# Patient Record
Sex: Male | Born: 2000 | Race: White | Hispanic: No | Marital: Single | Smoking: Never smoker
Health system: Southern US, Community
[De-identification: ages and names within clinical notes are randomized; demographics above are authoritative.]

---
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
## 2021-01-16 IMAGING — US US SCROTUM W/ DOPPLER COMPLETE
1 series · 14 of 25 positions shown · non-contrast
Comparison: None.

CLINICAL DATA: Left testicular pain for 3 days

EXAM:
SCROTAL ULTRASOUND
DOPPLER ULTRASOUND OF THE TESTICLES
TECHNIQUE: Complete ultrasound examination of the testicles, epididymis, and
other scrotal structures was performed. Color and spectral Doppler
ultrasound were also utilized to evaluate blood flow to the
testicles.

[Series 1: us scrotum w/ doppler complete · 14 of 85 slices shown]
[im 1/85]
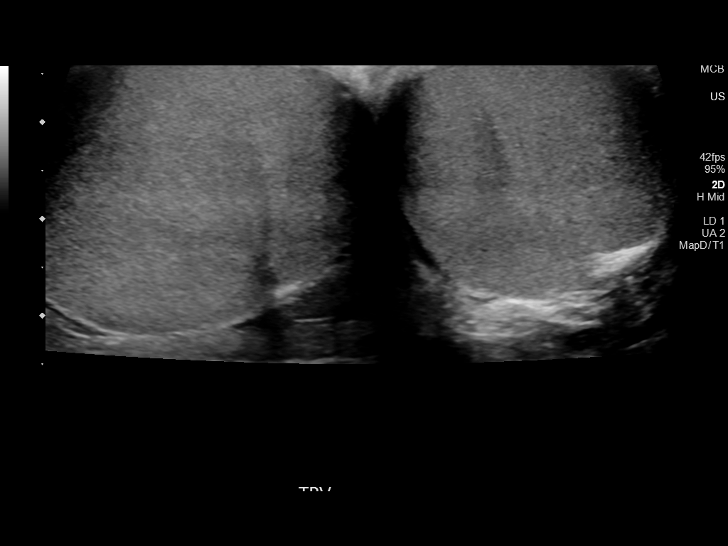
[im 8/85]
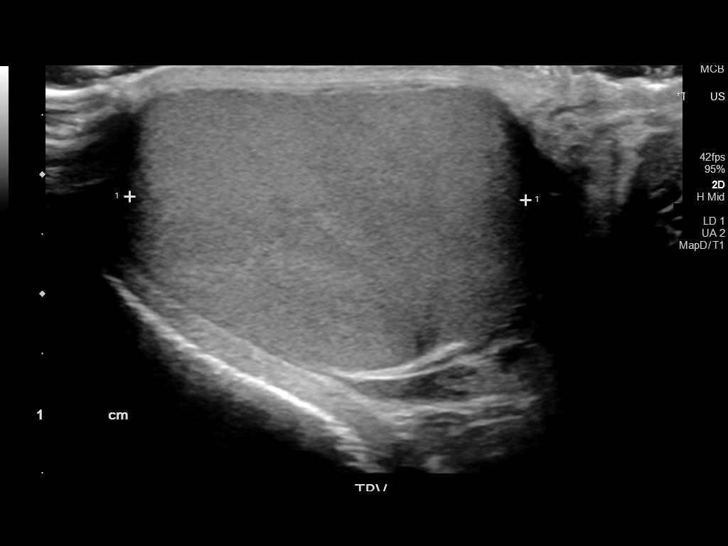
[im 15/85]
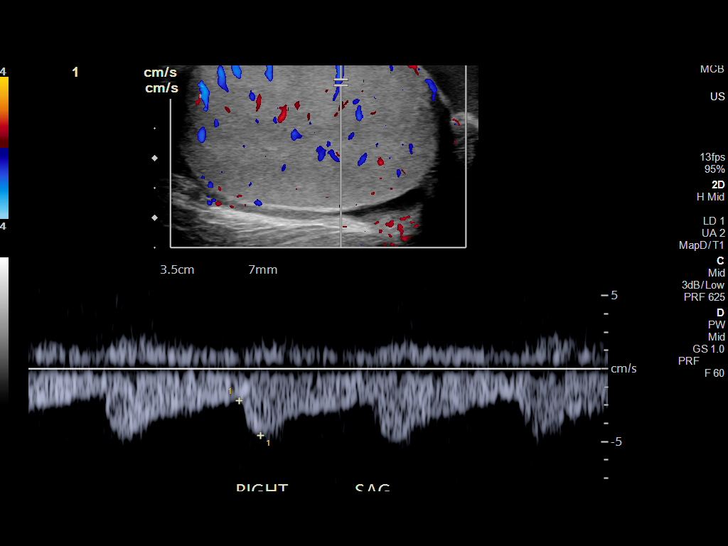
[im 22/85]
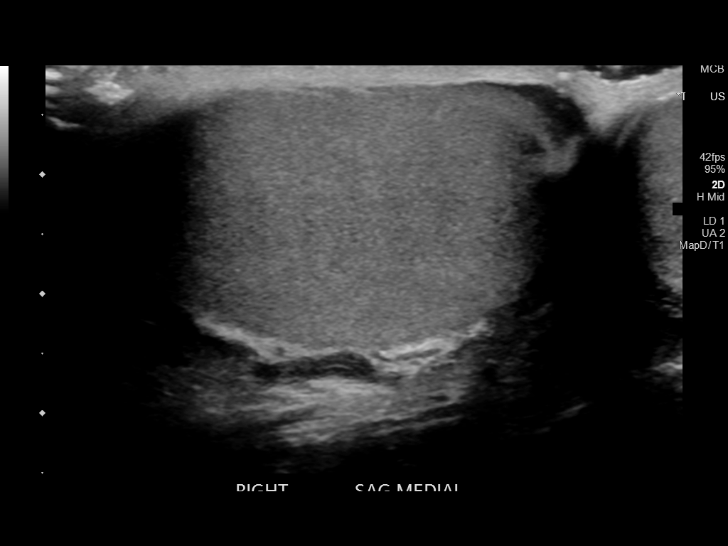
[im 29/85]
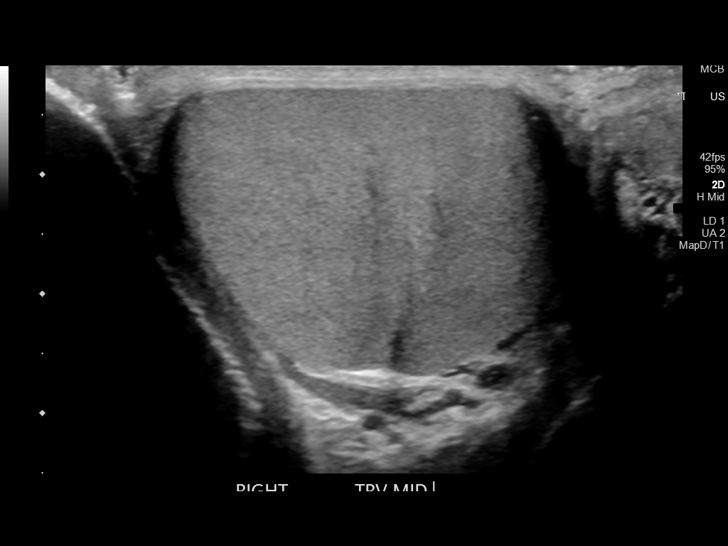
[im 32/85]
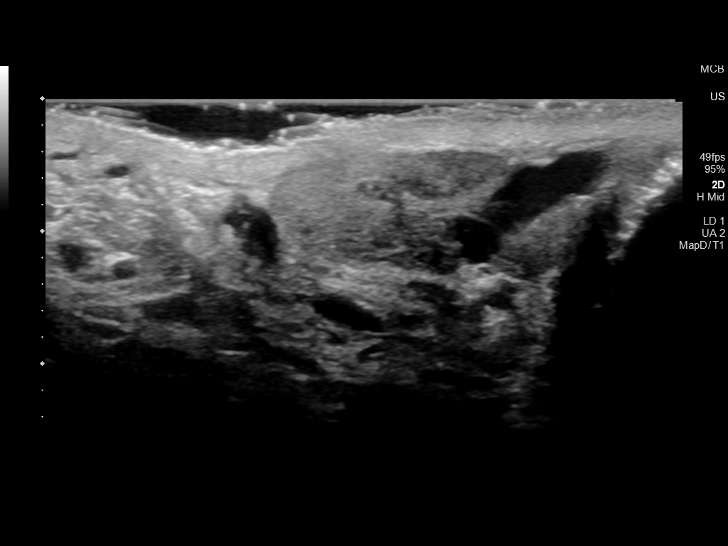
[im 39/85]
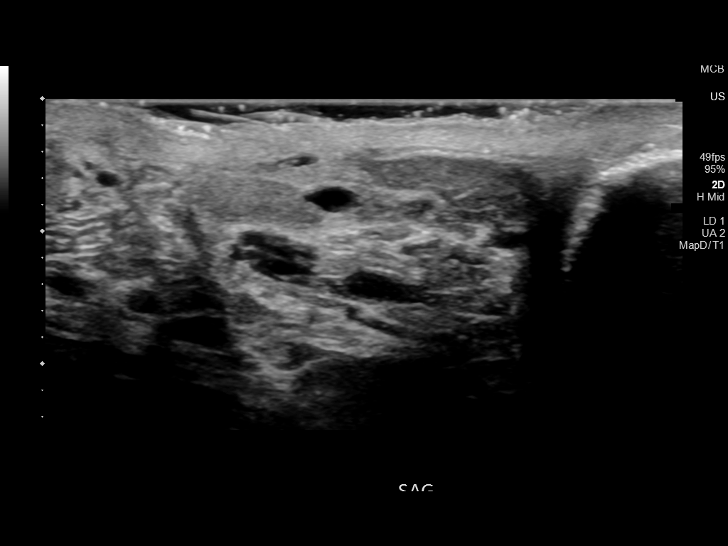
[im 46/85]
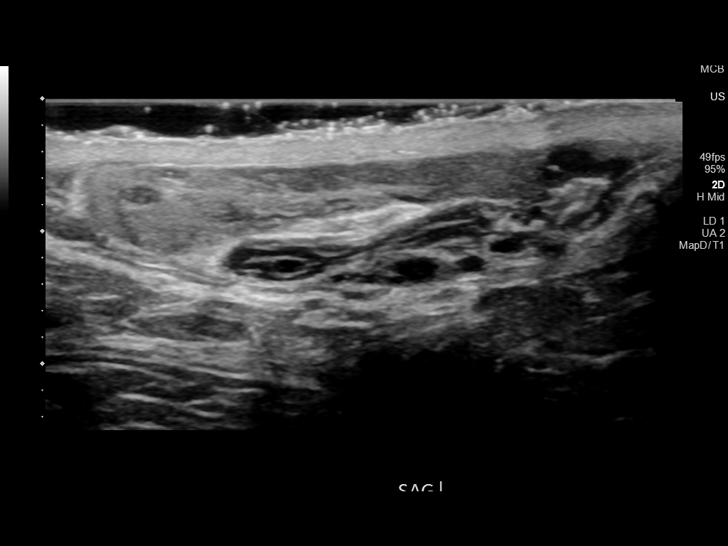
[im 53/85]
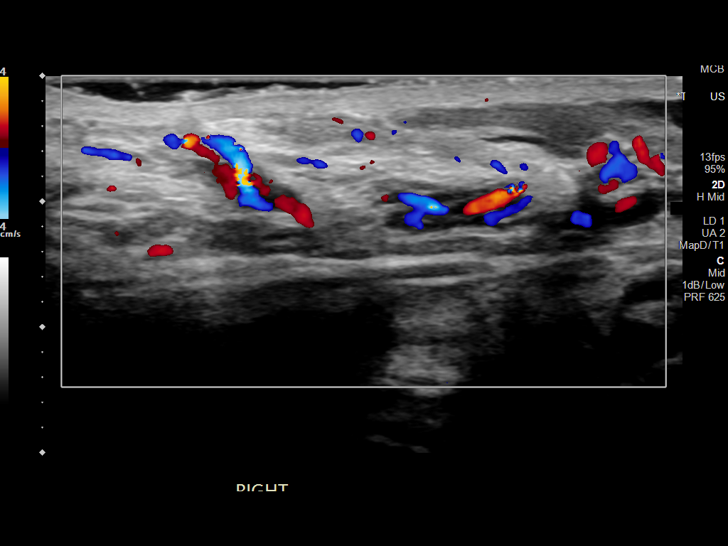
[im 57/85]
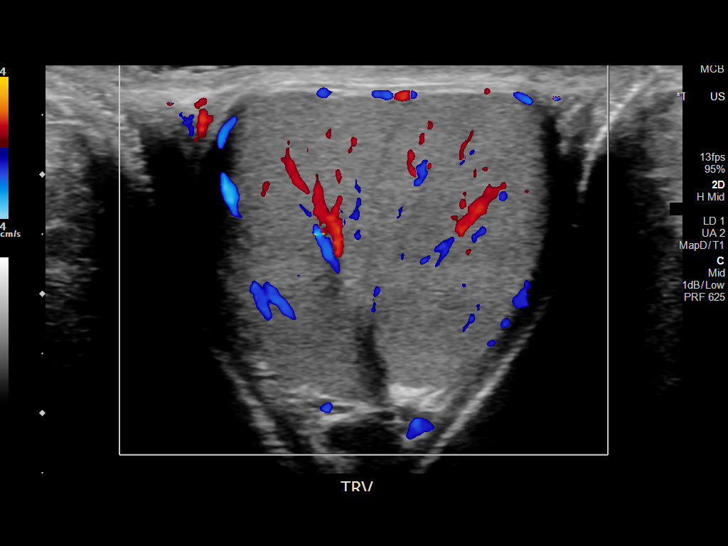
[im 64/85]
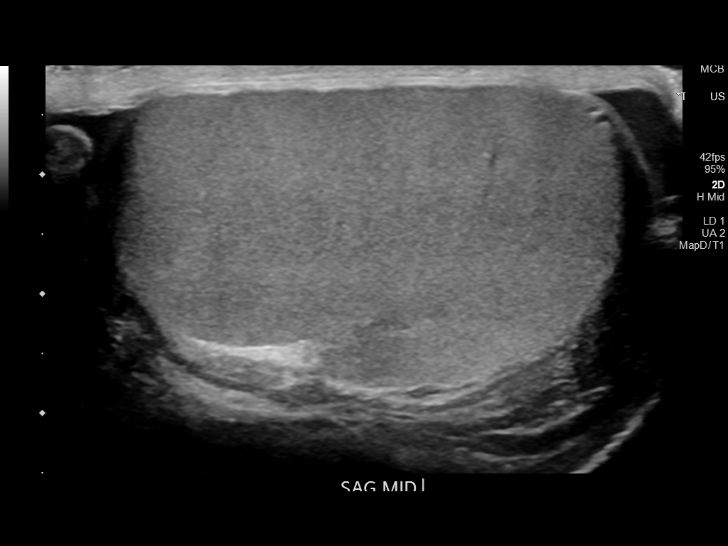
[im 71/85]
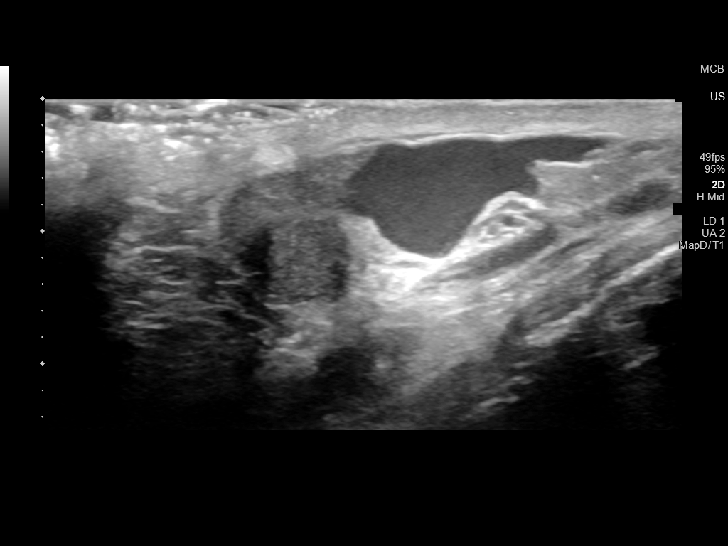
[im 78/85]
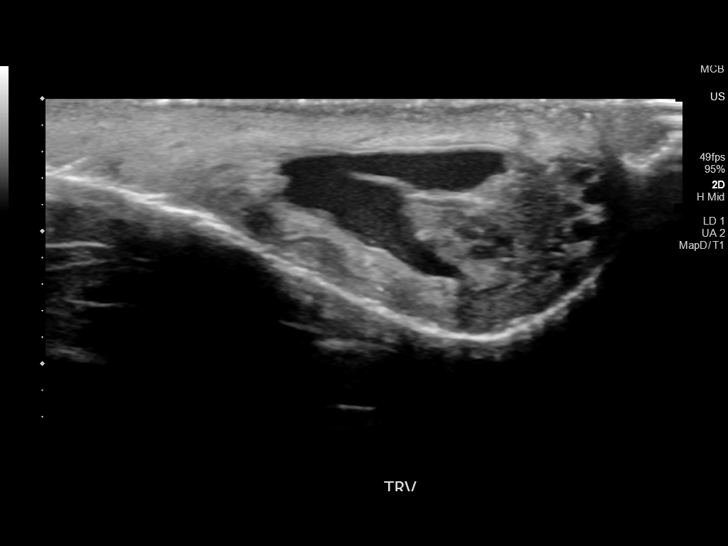
[im 85/85]
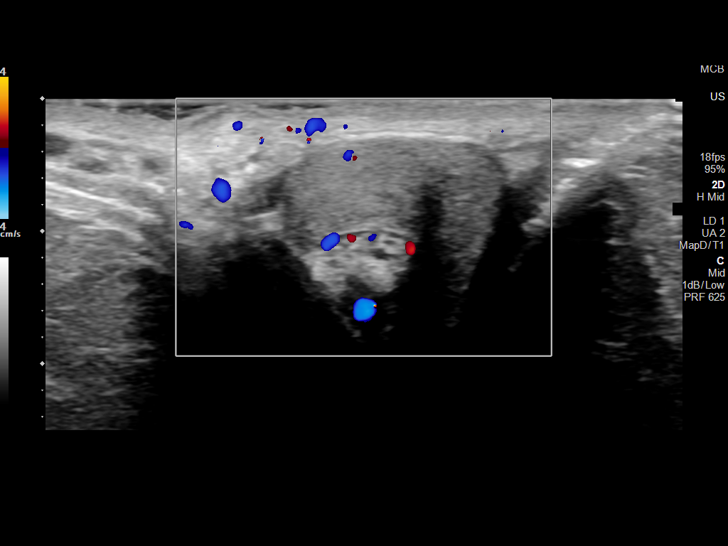

[14 of 25 positions shown; findings below may reference images not displayed]

FINDINGS: Right testicle

Measurements: 4.6 x 2.5 x 3.3 cm. No mass or microlithiasis
visualized.

Left testicle

Measurements: 4.3 x 2.6 x 2.8 cm. No mass or microlithiasis
visualized.

Right epididymis: Normal in size and appearance. 3 mm anechoic right
epididymal head cyst.

Left epididymis: Normal in size and appearance. 3 mm anechoic left
epididymal head cyst.

Hydrocele: Small left hydrocele with some low level internal echoes.
No right hydrocele.

Varicocele:  None visualized.

Pulsed Doppler interrogation of both testes demonstrates normal low
resistance arterial and venous waveforms bilaterally.
IMPRESSION: No evidence of testicular mass or torsion.

Trace nonspecific left hydrocele with low-level internal echoes. No
convincing features of epididymo-orchitis.

## 2021-02-12 IMAGING — US US PELVIS COMPLETE
1 series · 14 of 25 positions shown · non-contrast
Comparison: None

CLINICAL DATA: BILATERAL groin pain

EXAM:
LIMITED ULTRASOUND OF PELVIS
TECHNIQUE: Limited transabdominal ultrasound examination of the pelvis was
performed.

[Series 1: us pelvis complete · 41 acquisitions, 14 frames shown]
[im 1/41]
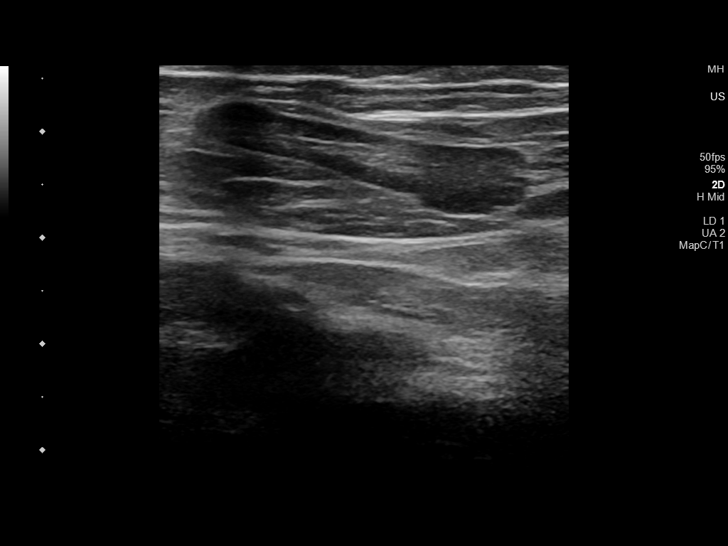
[im 4/41]
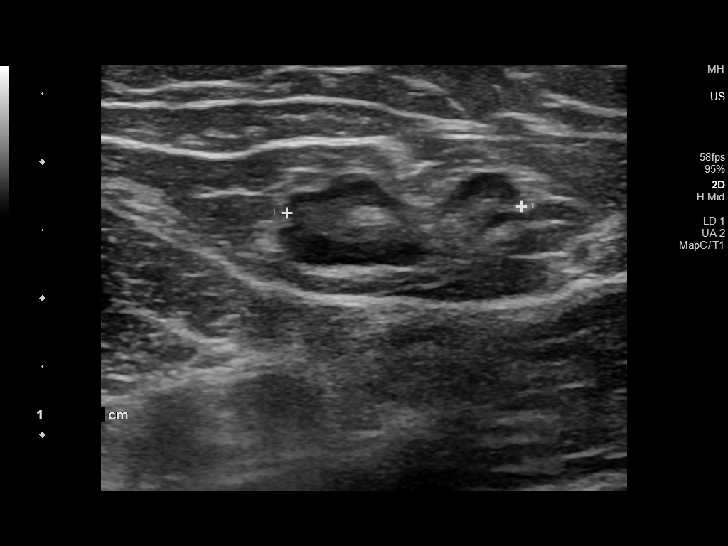
[im 7/41]
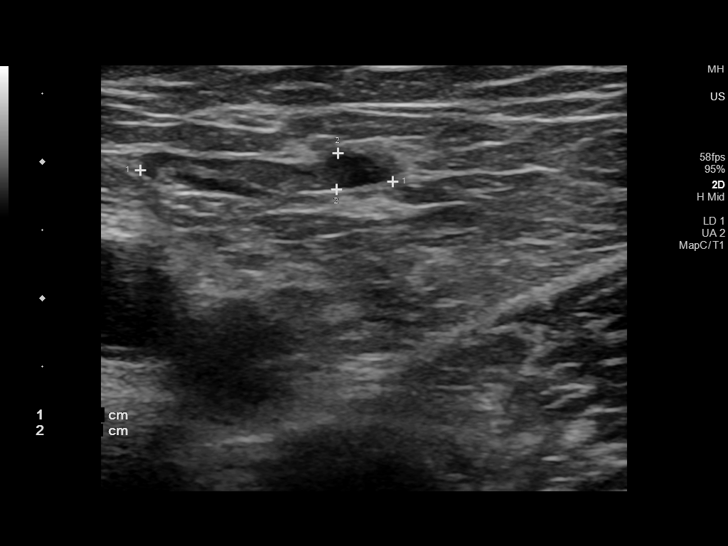
[im 11/41]
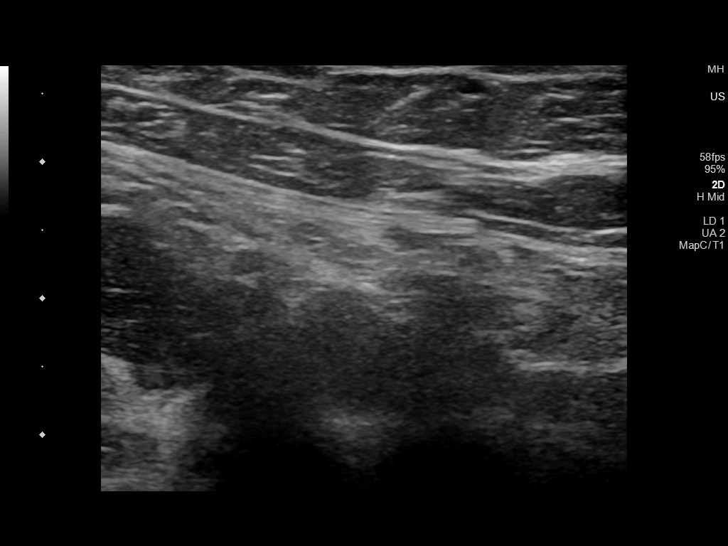
[im 14/41]
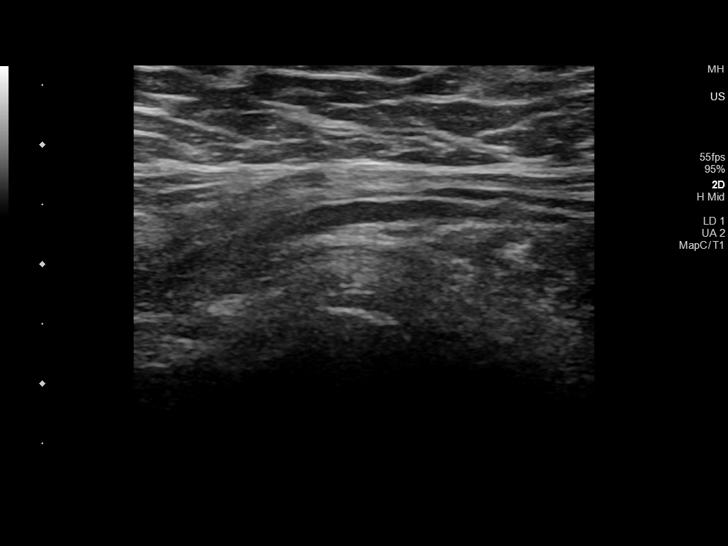
[im 16/41]
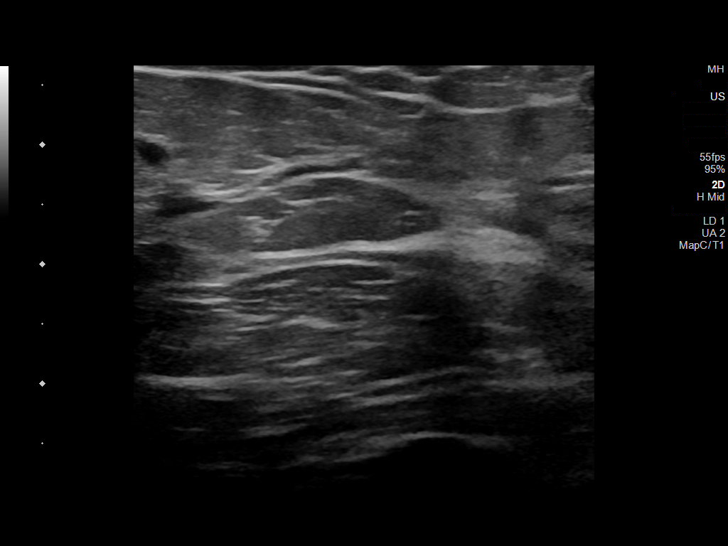
[im 19/41]
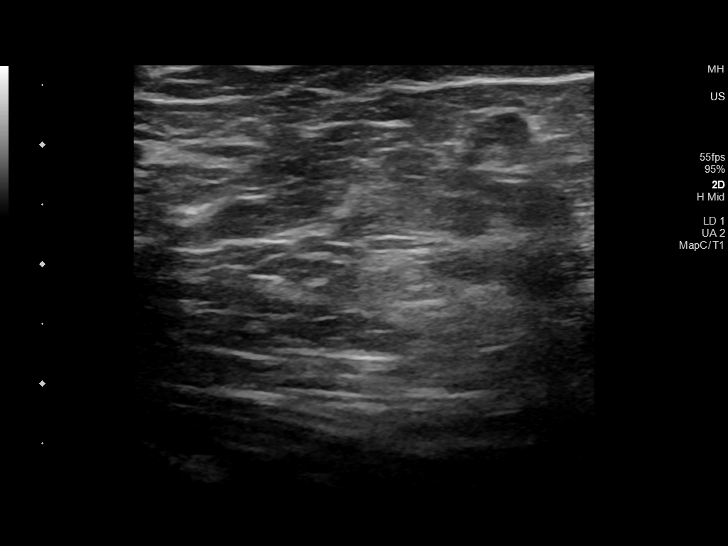
[im 22/41]
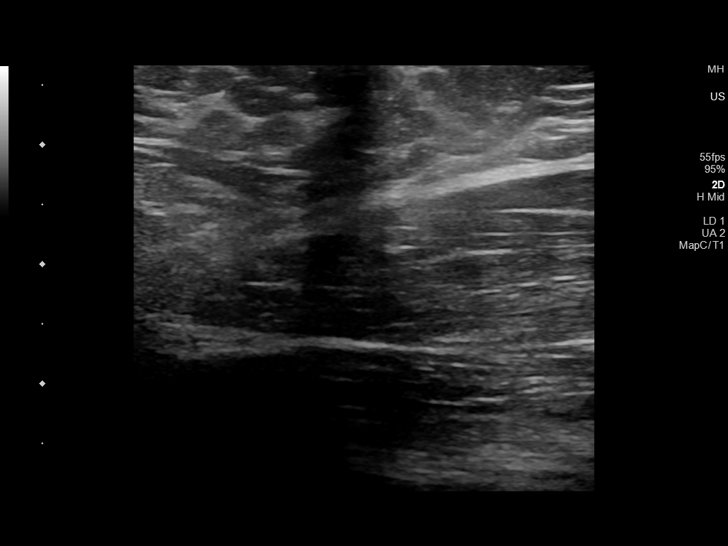
[im 26/41]
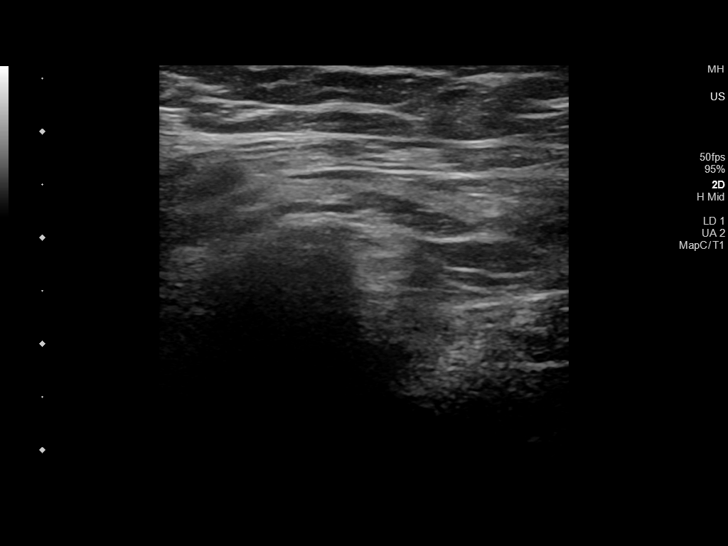
[im 27/41]
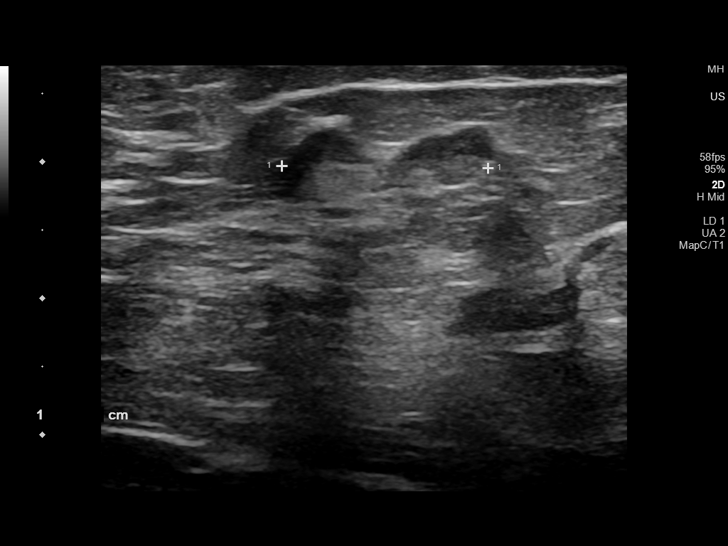
[im 31/41]
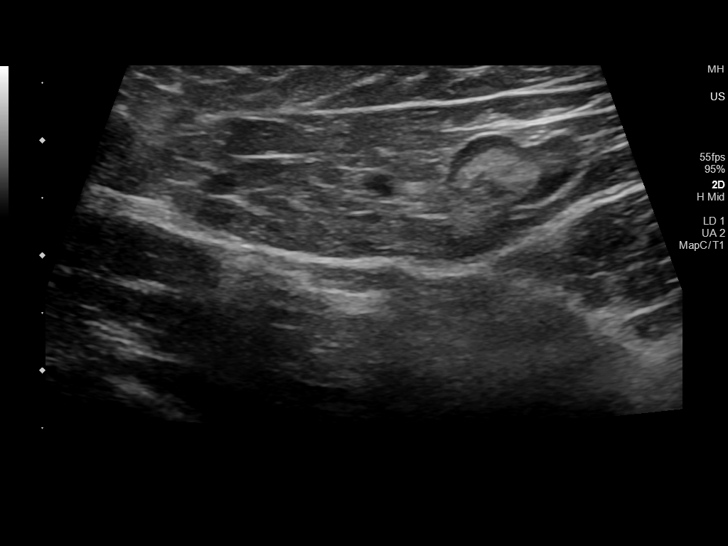
[im 34/41]
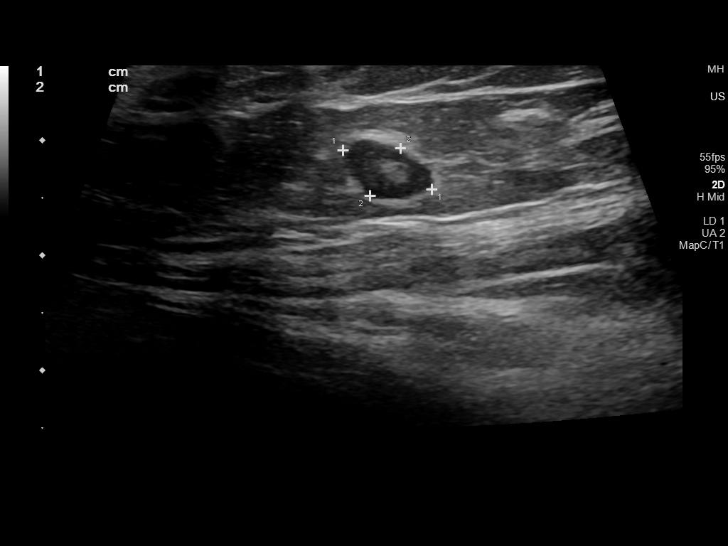
[im 37/41]
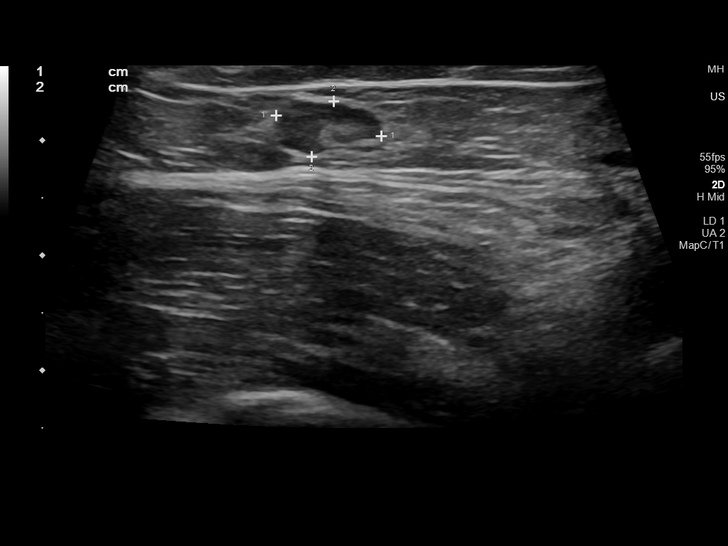
[im 41/41]
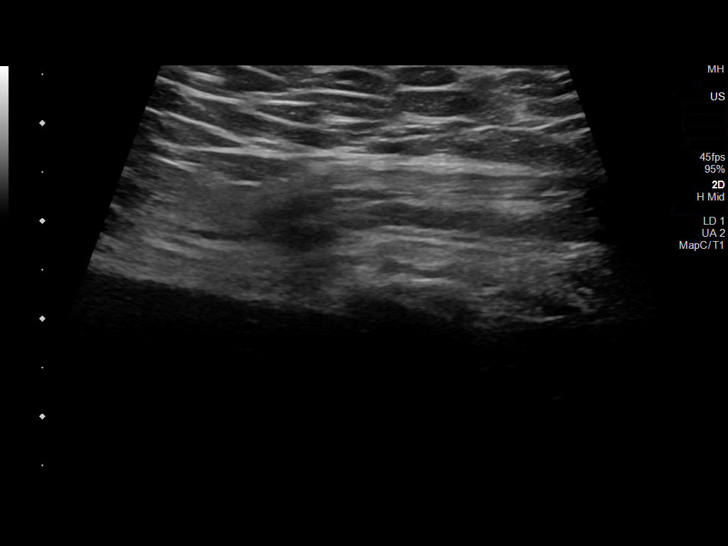

[14 of 25 positions shown; findings below may reference images not displayed]

FINDINGS: Normal appearing BILATERAL inguinal lymph nodes are identified.

These demonstrate hypoechoic cortical rim and central fat.

None appear pathologically enlarged or abnormal in morphology.

No inguinal hernia, fluid collection or adenopathy seen.
IMPRESSION: Normal appearing lymph nodes are seen within the inguinal regions
bilaterally.

No evidence of inguinal mass, adenopathy or hernia.

## 2023-10-26 ENCOUNTER — Emergency Department (HOSPITAL_COMMUNITY)
Admission: EM | Admit: 2023-10-26 | Discharge: 2023-10-26 | Disposition: A | Discharge status: Home / Self Care | Payer: Medicaid Other | Attending: Emergency Medicine | Admitting: Emergency Medicine

## 2023-10-26 ENCOUNTER — Other Ambulatory Visit: Payer: Self-pay

## 2023-10-26 ENCOUNTER — Emergency Department (HOSPITAL_COMMUNITY): Payer: Medicaid Other

## 2023-10-26 ENCOUNTER — Encounter (HOSPITAL_COMMUNITY): Payer: Self-pay

## 2023-10-26 DIAGNOSIS — R519 Headache, unspecified: Secondary | ICD-10-CM | POA: Diagnosis present

## 2023-10-26 LAB — CBC WITH DIFFERENTIAL/PLATELET
Abs Immature Granulocytes: 0.03 10*3/uL (ref 0.00–0.07)
Basophils Absolute: 0 10*3/uL (ref 0.0–0.1)
Basophils Relative: 0 %
Eosinophils Absolute: 0.3 10*3/uL (ref 0.0–0.5)
Eosinophils Relative: 3 %
HCT: 48.4 % (ref 39.0–52.0)
Hemoglobin: 16.9 g/dL (ref 13.0–17.0)
Immature Granulocytes: 0 %
Lymphocytes Relative: 25 %
Lymphs Abs: 2.3 10*3/uL (ref 0.7–4.0)
MCH: 31 pg (ref 26.0–34.0)
MCHC: 34.9 g/dL (ref 30.0–36.0)
MCV: 88.6 fL (ref 80.0–100.0)
Monocytes Absolute: 0.5 10*3/uL (ref 0.1–1.0)
Monocytes Relative: 5 %
Neutro Abs: 6.3 10*3/uL (ref 1.7–7.7)
Neutrophils Relative %: 67 %
Platelets: 303 10*3/uL (ref 150–400)
RBC: 5.46 MIL/uL (ref 4.22–5.81)
RDW: 12.1 % (ref 11.5–15.5)
WBC: 9.4 10*3/uL (ref 4.0–10.5)
nRBC: 0 % (ref 0.0–0.2)

## 2023-10-26 LAB — BASIC METABOLIC PANEL
Anion gap: 9 (ref 5–15)
BUN: 16 mg/dL (ref 6–20)
CO2: 25 mmol/L (ref 22–32)
Calcium: 9.4 mg/dL (ref 8.9–10.3)
Chloride: 103 mmol/L (ref 98–111)
Creatinine, Ser: 0.96 mg/dL (ref 0.61–1.24)
GFR, Estimated: >60 mL/min (ref >60)
Glucose, Bld: 100 mg/dL — ABNORMAL HIGH (ref 70–99)
Potassium: 4 mmol/L (ref 3.5–5.1)
Sodium: 137 mmol/L (ref 135–145)

## 2023-10-26 LAB — COOXEMETRY PANEL
Carboxyhemoglobin: 1.6 % — ABNORMAL HIGH (ref 0.5–1.5)
Methemoglobin: 0.7 % (ref 0.0–1.5)
O2 Saturation: 76.6 %
Total hemoglobin: 17.5 g/dL — ABNORMAL HIGH (ref 12.0–16.0)

## 2023-10-26 MED ORDER — METOCLOPRAMIDE HCL 5 MG/ML IJ SOLN
10.0000 mg | Freq: Once | INTRAMUSCULAR | Status: AC
  Administered 2023-10-26: 10 mg via INTRAVENOUS
  Filled 2023-10-26: qty 2

## 2023-10-26 MED ORDER — SODIUM CHLORIDE 0.9 % IV BOLUS
1000.0000 mL | Freq: Once | INTRAVENOUS | Status: AC
  Administered 2023-10-26: 1000 mL via INTRAVENOUS

## 2023-10-26 MED ORDER — KETOROLAC TROMETHAMINE 15 MG/ML IJ SOLN
15.0000 mg | Freq: Once | INTRAMUSCULAR | Status: AC
  Administered 2023-10-26: 15 mg via INTRAVENOUS
  Filled 2023-10-26: qty 1

## 2023-10-26 MED ORDER — DEXAMETHASONE SODIUM PHOSPHATE 4 MG/ML IJ SOLN
4.0000 mg | Freq: Once | INTRAMUSCULAR | Status: AC
  Administered 2023-10-26: 4 mg via INTRAVENOUS
  Filled 2023-10-26: qty 1

## 2023-10-26 NOTE — Discharge Instructions (Signed)
You have been seen today for your complaint of headache. Your lab work was reassuring. Your imaging was reassuring. Your discharge medications include Alternate tylenol and ibuprofen for pain. You may alternate these every 4 hours. You may take up to 800 mg of ibuprofen at a time and up to 1000 mg of tylenol. Your triptan medication.  This should be taken within 2 hours of headache onset in order to work appropriately. Follow up with: Your PCP in 1 week for reevaluation Please seek immediate medical care if you develop any of the following symptoms: Your headache: Becomes severe quickly. Gets worse after moderate to intense physical activity. You have any of these symptoms: Repeated vomiting. Pain or stiffness in your neck. Changes to your vision. Pain in an eye or ear. Problems with speech. Muscular weakness or loss of muscle control. Loss of balance or coordination. You feel faint or pass out. You have confusion. You have a seizure. At this time there does not appear to be the presence of an emergent medical condition, however there is always the potential for conditions to change. Please read and follow the below instructions.  Do not take your medicine if  develop an itchy rash, swelling in your mouth or lips, or difficulty breathing; call 911 and seek immediate emergency medical attention if this occurs.  You may review your lab tests and imaging results in their entirety on your MyChart account.  Please discuss all results of fully with your primary care provider and other specialist at your follow-up visit.  Note: Portions of this text may have been transcribed using voice recognition software. Every effort was made to ensure accuracy; however, inadvertent computerized transcription errors may still be present.

## 2023-10-26 NOTE — ED Provider Notes (Signed)
[place] EMERGENCY DEPARTMENT AT [facility] Provider Note   CSN: [id number] Arrival date & time: 10/26/23  1402     History  Chief Complaint  Patient presents with   Headache    Stephen Hicks. is a 22 y.o. male.  Presenting to the ED for evaluation of a headache.  He states the headache began 5 days ago and has progressively gotten worse.  Localized mostly to the left side of the head and radiates to the left side of the neck.  He is unable to describe what the headache feels like.  He states it is constant.  He does report a history of migraines but has never been evaluated by neurology for them.  He states it feels like previous migraines but more painful.  He states he had a headache that lasted 7 days approximately 1 month ago.  He presented to his primary care provider and had labs, a CT of the head and rizatriptan ordered.  He states rizatriptan has not improved his symptoms.  He denies any vision changes, numbness, weakness, tingling, fevers, nausea, vomiting.  States approximately 9 years ago he had a migraine which caused him to lose his vision.  She reports mild photophobia.  No phonophobia.   Headache      Home Medications Prior to Admission medications   Medication Sig Start Date End Date Taking? Authorizing Provider  acetaminophen (TYLENOL) 325 MG tablet Take 650 mg by mouth every 6 (six) hours as needed for mild pain.    [provider]  albuterol (VENTOLIN HFA) 108 (90 Base) MCG/ACT inhaler Inhale 2 puffs into the lungs every 4 (four) hours as needed for shortness of breath or wheezing. 08/04/19   [provider]  doxycycline (VIBRAMYCIN) 100 MG capsule Take 1 capsule (100 mg total) by mouth 2 (two) times daily. 11/30/19   Wynetta Fines, MD      Allergies    Prednisone, Diphenhydramine, and Diphenhydramine hcl    Review of Systems   Review of Systems  Neurological:  Positive for headaches.  All other systems reviewed and are  negative.   Physical Exam Updated Vital Signs BP (!) 148/87 (BP Location: Left Arm)   Pulse 87   Temp 97.9 F (36.6 C) (Oral)   Resp 18   Ht 5' 11.5" (1.816 m)   Wt 87.5 kg   SpO2 100%   BMI 26.54 kg/m  Physical Exam Vitals and nursing note reviewed.  Constitutional:      General: He is not in acute distress.    Appearance: He is well-developed.  HENT:     Head: Normocephalic and atraumatic.  Eyes:     Conjunctiva/sclera: Conjunctivae normal.  Cardiovascular:     Rate and Rhythm: Normal rate and regular rhythm.     Heart sounds: No murmur heard. Pulmonary:     Effort: Pulmonary effort is normal. No respiratory distress.     Breath sounds: Normal breath sounds.  Abdominal:     Palpations: Abdomen is soft.     Tenderness: There is no abdominal tenderness.  Musculoskeletal:        General: No swelling.     Cervical back: Neck supple.  Skin:    General: Skin is warm and dry.     Capillary Refill: Capillary refill takes less than 2 seconds.  Neurological:     Mental Status: He is alert and oriented to person, place, and time.     Comments:   MENTAL STATUS: AAOx3  LANG/SPEECH: Fluent, intact naming, repetition & comprehension   CRANIAL NERVES:   II: Pupils equal and reactive   III, IV, VI: EOM intact, no gaze preference or deviation, no nystagmus   V: normal sensation of the face   VII: no facial asymmetry   VIII: normal hearing to speech   MOTOR: 5/5 in both upper and lower extremities   SENSORY: Normal to touch in all extremiteis   COORD: Normal finger to nose, heel to shin and shoulder shrug, no tremor, no dysmetria. No pronator drift   Psychiatric:        Mood and Affect: Mood normal.     ED Results / Procedures / Treatments   Labs (all labs ordered are listed, but only abnormal results are displayed) Labs Reviewed  COOXEMETRY PANEL - Abnormal; Notable for the following components:      Result Value   Total hemoglobin 17.5 (*)    Carboxyhemoglobin 1.6  (*)    All other components within normal limits  BASIC METABOLIC PANEL - Abnormal; Notable for the following components:   Glucose, Bld 100 (*)    All other components within normal limits  CBC WITH DIFFERENTIAL/PLATELET    EKG None  Radiology CT HEAD WO CONTRAST  Result Date: 10/26/2023 CLINICAL DATA:  Headache radiating into neck and back for 5 days, sensitivity to light EXAM: CT HEAD WITHOUT CONTRAST TECHNIQUE: Contiguous axial images were obtained from the base of the skull through the vertex without intravenous contrast. RADIATION DOSE REDUCTION: This exam was performed according to the departmental dose-optimization program which includes automated exposure control, adjustment of the mA and/or kV according to patient size and/or use of iterative reconstruction technique. COMPARISON:  10/24/2015 FINDINGS: Brain: No evidence of acute infarction, hemorrhage, mass, mass effect, or midline shift. No hydrocephalus or extra-axial fluid collection. Vascular: No hyperdense vessel. Skull: Negative for fracture or focal lesion. Sinuses/Orbits: Clear paranasal sinuses. No acute finding in the orbits. Other: The mastoid air cells are well aerated. IMPRESSION: No acute intracranial process. No etiology is seen for the patient's headache. Electronically Signed   By: Wiliam Ke M.D.   On: 10/26/2023 18:35    Procedures Procedures    Medications Ordered in ED Medications  ketorolac (TORADOL) 15 MG/ML injection 15 mg (15 mg Intravenous Given 10/26/23 1707)  metoCLOPramide (REGLAN) injection 10 mg (10 mg Intravenous Given 10/26/23 1707)  sodium chloride 0.9 % bolus 1,000 mL (0 mLs Intravenous Stopped 10/26/23 1755)  dexamethasone (DECADRON) injection 4 mg (4 mg Intravenous Given 10/26/23 1727)    ED Course/ Medical Decision Making/ A&P                                 Medical Decision Making Amount and/or Complexity of Data Reviewed Labs: ordered. Radiology: ordered.  Risk Prescription drug  management.  This patient presents to the ED for concern of headache, this involves an extensive number of treatment options, and is a complaint that carries with it a high risk of complications and morbidity. Emergent considerations for headache include subarachnoid hemorrhage, meningitis, temporal arteritis, glaucoma, cerebral ischemia, carotid/vertebral dissection, intracranial tumor, Venous sinus thrombosis, carbon monoxide poisoning, acute or chronic subdural hemorrhage.  Other considerations include: Migraine, Cluster headache, Hypertension, Caffeine, alcohol, or drug withdrawal, Pseudotumor cerebri, Arteriovenous malformation, Head injury, Neurocysticercosis, Post-lumbar puncture, Preeclampsia, Tension headache, Sinusitis, Cervical arthritis, Refractive error causing strain, Dental abscess, Otitis media, Temporomandibular joint syndrome, Depression, Somatoform disorder (eg, somatization) Trigeminal  neuralgia, Glossopharyngeal neuralgia.   My initial workup includes labs, CT, symptom control  Additional history obtained from: Nursing notes from this visit.  I ordered, reviewed and interpreted labs which include: BMP, CBC, coox panel  I ordered imaging studies including CT head I independently visualized and interpreted imaging which showed normal I agree with the radiologist interpretation  Afebrile, hypertensive but otherwise hemodynamically stable.  22 year old male presenting to the ED for evaluation of a headache.  He has a history of migraines and states this feels similar but is worse in intensity.  Pain also radiates to the left side of the neck.  He appears very well on physical exams.  No focal neurologic deficits.  No meningismus.  Lab workup was reassuring.  CT of the head was without abnormal findings.  Patient reported resolution of his symptoms after treatment in the emergency department.  Low suspicion for SAH, IIH or intraparenchymal hemorrhage.  Likely migraine.  He was given a  triptan medication by his PCP yesterday.  He was notified that this typically only works if you take it within a few hours of symptom onset.  He was given return precautions.  Stable at discharge.  At this time there does not appear to be any evidence of an acute emergency medical condition and the patient appears stable for discharge with appropriate outpatient follow up. Diagnosis was discussed with patient who verbalizes understanding of care plan and is agreeable to discharge. I have discussed return precautions with patient who verbalizes understanding. Patient encouraged to follow-up with their PCP within 1 week. All questions answered.  Note: Portions of this report may have been transcribed using voice recognition software. Every effort was made to ensure accuracy; however, inadvertent computerized transcription errors may still be present.        Final Clinical Impression(s) / ED Diagnoses Final diagnoses:  Bad headache    Rx / DC Orders ED Discharge Orders          Ordered    Ambulatory referral to Neurology       Comments: An appointment is requested in approximately: 2 weeks   10/26/23 1902              Mora Bellman 10/26/23 1902    Tegeler, Canary Brim, MD 10/26/23 [phone]

## 2023-10-26 NOTE — ED Triage Notes (Signed)
C/o headache radiating into neck and back x5 days.  Sensitivity to light.  Denies n/v Pt reports seen PCP yesterday and prescribed migraine med w/o relief

## 2023-12-05 ENCOUNTER — Ambulatory Visit: Payer: Medicaid Other | Admitting: Diagnostic Neuroimaging

## 2023-12-05 ENCOUNTER — Encounter: Payer: Self-pay | Admitting: Diagnostic Neuroimaging

## 2023-12-05 VITALS — BP 149/87 | HR 79 | Ht 70.0 in | Wt 199.0 lb

## 2023-12-05 DIAGNOSIS — G44209 Tension-type headache, unspecified, not intractable: Secondary | ICD-10-CM | POA: Diagnosis not present

## 2023-12-05 DIAGNOSIS — G43109 Migraine with aura, not intractable, without status migrainosus: Secondary | ICD-10-CM

## 2023-12-05 NOTE — Patient Instructions (Signed)
HEADACHES (with migraine features and tension HA features) - symptoms are improving; monitor for now  - consider MRI brain, sleep study if HA worsen  - To prevent or relieve headaches, try the following:  Cool Compress. Lie down and place a cool compress on your head.   Avoid headache triggers. If certain foods or odors seem to have triggered your migraines in the past, avoid them. A headache diary might help you identify triggers.   Include physical activity in your daily routine.  Manage stress. Find healthy ways to cope with the stressors, such as delegating tasks on your to-do list.   Practice relaxation techniques. Try deep breathing, yoga, massage and visualization.   Eat regularly. Eating regularly scheduled meals and maintaining a healthy diet might help prevent headaches. Also, drink plenty of fluids.   Follow a regular sleep schedule. Sleep deprivation might contribute to headaches Consider biofeedback. With this mind-body technique, you learn to control certain bodily functions -- such as muscle tension, heart rate and blood pressure -- to prevent headaches or reduce headache pain.

## 2023-12-05 NOTE — Progress Notes (Signed)
[facility]  PATIENT: Stephen Hicks. DOB: [birth year]  REFERRING CLINICIAN: Shelle Iron, MD HISTORY FROM: patient  REASON FOR VISIT:  new consult   HISTORICAL  CHIEF COMPLAINT:  Chief Complaint  Patient presents with   New Patient (Initial Visit)    Pt in 6 with dad Pt here for headaches Pt states daily headache. Pt states fatigue,hypertension,little snoring Pt dad has sleep apnea     HISTORY OF PRESENT ILLNESS:   22 year old male here for evaluation of headaches.  Patient is history of headaches when he was a child including severe explosive pressure sensation, vision changes, and early morning headaches.  1 time he went to the emergency room due to partial bilateral visual loss in the last several hours.  Eventually these headaches subsided.  Last 2 months patient has had different have a headache including bilateral periorbital, supraorbital pain and pressure, sinus congestion, sensitivity to light, dizziness and seeing dots.  Headache can be 5 out of 10 or 7 out of 10 in severity.  No significant nausea or sensitivity to sound.  Having some low-level early morning headaches when he wakes up.  Has some daytime fatigue.  Has been having some more stress in the last few months related to work and life.  Patient tried ibuprofen Tylenol without significant relief.  Has been using Excedrin migraine over-the-counter which helps a little bit.   REVIEW OF SYSTEMS: Full 14 system review of systems performed and negative with exception of: as per HPI.  ALLERGIES: Allergies  Allergen Reactions   Prednisone Other (See Comments)    Nervousness  Nervousness   Nervousness   Nervousness    Diphenhydramine Other (See Comments) and Palpitations    nervousness  nervousness  nervousness   Diphenhydramine Hcl Palpitations    HOME MEDICATIONS: Outpatient Medications Prior to Visit  Medication Sig Dispense Refill   albuterol (VENTOLIN HFA) 108 (90 Base)  MCG/ACT inhaler Inhale 2 puffs into the lungs every 4 (four) hours as needed for shortness of breath or wheezing.     acetaminophen (TYLENOL) 325 MG tablet Take 650 mg by mouth every 6 (six) hours as needed for mild pain.     doxycycline (VIBRAMYCIN) 100 MG capsule Take 1 capsule (100 mg total) by mouth 2 (two) times daily. 20 capsule 0   No facility-administered medications prior to visit.    PAST MEDICAL HISTORY: History reviewed. No pertinent past medical history.  PAST SURGICAL HISTORY: History reviewed. No pertinent surgical history.  FAMILY HISTORY: Family History  Problem Relation Age of Onset   Headache Mother    Migraines Mother    Sleep apnea Father     SOCIAL HISTORY: Social History   Socioeconomic History   Marital status: Single    Spouse name: Not on file   Number of children: Not on file   Years of education: Not on file   Highest education level: Not on file  Occupational History   Not on file  Tobacco Use   Smoking status: Never   Smokeless tobacco: Never  Substance and Sexual Activity   Alcohol use: No   Drug use: No   Sexual activity: Not on file  Other Topics Concern   Not on file  Social History Narrative   Live with parents   Pt works    Social Drivers of Corporate investment banker Strain: Not on File (06/17/2022)   Received from [employer] [facility] Strain: 0  Food Insecurity: Not on File (09/15/2023)   Received from [employer]    Food: 0  Transportation Needs: Not on File (06/17/2022)   Received from [employer] Needs    Transportation: 0  Physical Activity: Not on File (06/17/2022)   Received from [facility]   Physical Activity    Physical Activity: 0  Stress: Not on File (06/17/2022)   Received from [facility]   Stress    Stress: 0  Social Connections: Not on File (09/03/2023)   Received from [employer]   Social Connections    Connectedness: 0  Intimate Partner Violence: Not on file      PHYSICAL EXAM  GENERAL EXAM/CONSTITUTIONAL: Vitals:  Vitals:   12/05/23 1548  BP: (!) 149/87  Pulse: 79  Weight: 199 lb (90.3 kg)  Height: 5\' 10"  (1.778 m)   Body mass index is 28.55 kg/m. Wt Readings from Last 3 Encounters:  12/05/23 199 lb (90.3 kg)  10/26/23 193 lb (87.5 kg)  11/30/19 181 lb 12.8 oz (82.5 kg) (90%, Z= 1.26)*   * Growth percentiles are based on CDC (Boys, 2-20 Years) data.   Patient is in no distress; well developed, nourished and groomed; neck is supple  CARDIOVASCULAR: Examination of carotid arteries is normal; no carotid bruits Regular rate and rhythm, no murmurs Examination of peripheral vascular system by observation and palpation is normal  EYES: Ophthalmoscopic exam of optic discs and posterior segments is normal; no papilledema or hemorrhages No results found.  MUSCULOSKELETAL: Gait, strength, tone, movements noted in Neurologic exam below  NEUROLOGIC: MENTAL STATUS:      No data to display         awake, alert, oriented to person, place and time recent and remote memory intact normal attention and concentration language fluent, comprehension intact, naming intact fund of knowledge appropriate  CRANIAL NERVE:  2nd - no papilledema on fundoscopic exam 2nd, 3rd, 4th, 6th - pupils equal and reactive to light, visual fields full to confrontation, extraocular muscles intact, no nystagmus 5th - facial sensation symmetric 7th - facial strength symmetric 8th - hearing intact 9th - palate elevates symmetrically, uvula midline 11th - shoulder shrug symmetric 12th - tongue protrusion midline  MOTOR:  normal bulk and tone, full strength in the BUE, BLE  SENSORY:  normal and symmetric to light touch, temperature, vibration  COORDINATION:  finger-nose-finger, fine finger movements normal  REFLEXES:  deep tendon reflexes TRACE and symmetric  GAIT/STATION:  narrow based gait     DIAGNOSTIC DATA (LABS, IMAGING, TESTING) -  I reviewed patient records, labs, notes, testing and imaging myself where available.  Lab Results  Component Value Date   WBC 9.4 10/26/2023   HGB 16.9 10/26/2023   HCT 48.4 10/26/2023   MCV 88.6 10/26/2023   PLT 303 10/26/2023      Component Value Date/Time   NA 137 10/26/2023 1630   K 4.0 10/26/2023 1630   CL 103 10/26/2023 1630   CO2 25 10/26/2023 1630   GLUCOSE 100 (H) 10/26/2023 1630   BUN 16 10/26/2023 1630   CREATININE 0.96 10/26/2023 1630   CALCIUM 9.4 10/26/2023 1630   GFRNONAA >60 10/26/2023 1630   No results found for: "CHOL", "HDL", "LDLCALC", "LDLDIRECT", "TRIG", "CHOLHDL" No results found for: "HGBA1C" No results found for: "VITAMINB12" No results found for: "TSH"   10/26/23 CT head  - No acute intracranial process. No etiology is seen for the patient's headache.   ASSESSMENT AND PLAN  22 y.o. year  old male here with:   Dx:  1. Tension headache   2. Migraine with aura and without status migrainosus, not intractable     PLAN:  HEADACHES (with migraine features and tension HA features)  - symptoms are improving; monitor for now  - consider MRI brain, sleep study if HA worsen  - To prevent or relieve headaches, try the following:  Cool Compress. Lie down and place a cool compress on your head.   Avoid headache triggers. If certain foods or odors seem to have triggered your migraines in the past, avoid them. A headache diary might help you identify triggers.   Include physical activity in your daily routine.  Manage stress. Find healthy ways to cope with the stressors, such as delegating tasks on your to-do list.   Practice relaxation techniques. Try deep breathing, yoga, massage and visualization.   Eat regularly. Eating regularly scheduled meals and maintaining a healthy diet might help prevent headaches. Also, drink plenty of fluids.   Follow a regular sleep schedule. Sleep deprivation might contribute to headaches Consider biofeedback. With  this mind-body technique, you learn to control certain bodily functions -- such as muscle tension, heart rate and blood pressure -- to prevent headaches or reduce headache pain.  Return for pending if symptoms worsen or fail to improve.    Suanne Marker, MD 12/05/2023, 4:34 PM Certified in Neurology, Neurophysiology and Neuroimaging  [facility] [address], [address] [place], [place] [address] [phone]

## 2024-05-23 ENCOUNTER — Encounter (HOSPITAL_BASED_OUTPATIENT_CLINIC_OR_DEPARTMENT_OTHER): Payer: Self-pay | Admitting: Emergency Medicine

## 2024-05-23 ENCOUNTER — Ambulatory Visit (HOSPITAL_COMMUNITY)
Admission: EM | Admit: 2024-05-23 | Discharge: 2024-05-23 | Disposition: A | Discharge status: Home / Self Care | Source: Home / Self Care

## 2024-05-23 ENCOUNTER — Other Ambulatory Visit: Payer: Self-pay

## 2024-05-23 ENCOUNTER — Emergency Department (HOSPITAL_BASED_OUTPATIENT_CLINIC_OR_DEPARTMENT_OTHER): Admission: EM | Admit: 2024-05-23 | Discharge: 2024-05-23 | Disposition: A | Discharge status: Home / Self Care

## 2024-05-23 DIAGNOSIS — F4322 Adjustment disorder with anxiety: Secondary | ICD-10-CM | POA: Insufficient documentation

## 2024-05-23 DIAGNOSIS — Z79899 Other long term (current) drug therapy: Secondary | ICD-10-CM | POA: Diagnosis not present

## 2024-05-23 DIAGNOSIS — F410 Panic disorder [episodic paroxysmal anxiety] without agoraphobia: Secondary | ICD-10-CM | POA: Insufficient documentation

## 2024-05-23 DIAGNOSIS — Z5321 Procedure and treatment not carried out due to patient leaving prior to being seen by health care provider: Secondary | ICD-10-CM | POA: Insufficient documentation

## 2024-05-23 DIAGNOSIS — F419 Anxiety disorder, unspecified: Secondary | ICD-10-CM | POA: Insufficient documentation

## 2024-05-23 MED ORDER — HYDROXYZINE HCL 25 MG PO TABS: 25.0000 mg | ORAL_TABLET | Freq: Three times a day (TID) | ORAL | 0 refills | Status: AC | PRN

## 2024-05-23 NOTE — ED Triage Notes (Signed)
 Pt reports "anxiety attack" that began while at the gym with friends.  States he normally can calm down on his own but feels this one has lasted longer and "it won't stop"  Pt is alert and cooperative in triage in NAD>

## 2024-05-23 NOTE — Discharge Instructions (Addendum)
 Stephen Hicks

## 2024-05-23 NOTE — ED Notes (Signed)
No answer for rooming x1

## 2024-05-23 NOTE — Progress Notes (Signed)
   05/23/24 1326  [facility] Triage Screening (Walk-ins at [facility] only)  How Did You Hear About Us ? Self  What Is the Reason for Your Visit/Call Today? Pinedo is a 23 year old male presenting to [facility] unaccompanied. Pt states that he had a severe panic attack at 10 pm last night. He felt out of breath, was shaking and unable to control himself. Pt reports that he is not seeing a therapist or taking medication. Pt mentions that he is simply looking to be put on medication at this time of triage to prevent panic attacks. Pt denies substance use, Si, Hi and AVH.  How Long Has This Been Causing You Problems? <Week  Have You Recently Had Any Thoughts About Hurting Yourself? No  Are You Planning to Commit Suicide/Harm Yourself At This time? No  Have you Recently Had Thoughts About Hurting Someone Marigene Shoulder? No  Are You Planning To Harm Someone At This Time? No  Physical Abuse Denies  Verbal Abuse Denies  Sexual Abuse Denies  Exploitation of patient/patient's resources Denies  Self-Neglect Denies  Possible abuse reported to: Other (Comment)  Are you currently experiencing any auditory, visual or other hallucinations? No  Have You Used Any Alcohol or Drugs in the Past 24 Hours? No  Do you have any current medical co-morbidities that require immediate attention? No  Clinician description of patient physical appearance/behavior: calm, cooperative  What Do You Feel Would Help You the Most Today? Medication(s)  If access to [facility] was not available, would you have sought care in the Emergency Department? No  Determination of Need Routine (7 days)  Options For Referral Medication Management;Outpatient Therapy

## 2024-05-23 NOTE — ED Provider Notes (Signed)
 [facility] Medical Screening Exam  Patient Name: Stephen Mashek Jr. MRN: [id number] Date of Evaluation: 05/23/24 Chief Complaint: "I have been having panic attacks and would like to get help now". Diagnosis:  Final diagnoses:  Adjustment disorder with anxious mood  Panic attack    History of Present illness:  Stephen Kauer Jr. 23 y.o., male patient presented to [facility] as a voluntary walk in unaccompanied with complaints of I have been having panic attacks and want to get help now".  Adolphus Munnerlyn Jr., is seen face to face by this provider, consulted with Dr. Sherwood Donath; and chart reviewed on 05/23/24.  On evaluation Stephen Jurado Jr. reports that while he was at the gym yesterday, he had to learn a new routine while facing the entire group. He felt embarrassed and scared. Since then, he has been experiencing SOB, heart palpitations, fearfulness, and shaking. He reports difficulty sleeping since yesterday, although he normally gets about 8 hours of sleep.  He reports that he has not been formally diagnosed with panic attacks; however, he has been experiencing similar symptoms intermittently for the past five years. He states that these symptoms seem to have started after the loss of his older sister in 2019 due to a MVA, who was about 22 years old at the time, and they were very close. He denies seeking treatment or counseling following the incident.  He reports experiencing these episodes approximately every 2-5 months. Typically, the symptoms resolve after about 4 hours and some sleep. However, this current episode has not resolved, and he feels like he needs help to manage it. Vitals WNL.  He denies any illicit substance use. He is not currently taking any prescription medication and has a Pcp, Dr. Dayne Even, whom he saw about five months ago.  The patient reports that he attends [employer], is a Audiological scientist in psychology, and works at [employer]. He has a 83-year-old  son, with whom he shares custody with the child's mother. He states that the relationship has been somewhat stressful recently, as he believes his girlfriend may be experiencing some mental health concerns; she has been very irritable, which he finds stressful.  He reports having had a good childhood and denies any history of trauma or violence. He currently lives with parents, sister, son, and sometimes his girlfriend. He denies suicidal or homicidal ideation, denies access to firearms, and denies any history of self-harm.  During evaluation Stephen Swenor Jr. is sitting upright in no acute distress.  He is alert & oriented x 4, calm, cooperative and attentive for this assessment.  His mood is anxious with congruent affect.  He has normal speech, and behavior.  Objectively there is no evidence of psychosis/mania or delusional thinking. Pt does not appear to be responding to internal or external stimuli.  Patient is able to converse coherently, goal directed thoughts, no distractibility, or pre-occupation.He also denies suicidal/self-harm/homicidal ideation, psychosis, and paranoia.  Patient answered question appropriately.     Flowsheet Row ED from 05/23/2024 in [facility] Most recent reading at 05/23/2024  1:33 PM ED from 05/23/2024 in [facility] Emergency Department at [facility] Most recent reading at 05/23/2024 12:47 AM ED from 10/26/2023 in [facility] Emergency Department at [facility] Most recent reading at 10/26/2023  2:11 PM  C-SSRS RISK CATEGORY No Risk No Risk No Risk       Psychiatric Specialty Exam  Presentation  General Appearance:No data recorded Eye Contact:No data recorded  Speech:No data recorded Speech Volume:No data recorded Handedness:No data recorded  Mood and Affect  Mood:No data recorded Affect:No data recorded  Thought Process  Thought Processes:No data recorded Descriptions of Associations:No data recorded Orientation:No  data recorded Thought Content:No data recorded   Hallucinations:No data recorded Ideas of Reference:No data recorded Suicidal Thoughts:No data recorded Homicidal Thoughts:No data recorded  Sensorium  Memory:No data recorded Judgment:No data recorded Insight:No data recorded  Executive Functions  Concentration:No data recorded Attention Span:No data recorded Recall:No data recorded Fund of Knowledge:No data recorded Language:No data recorded  Psychomotor Activity  Psychomotor Activity:No data recorded  Assets  Assets:No data recorded  Sleep  Sleep:No data recorded Number of hours: No data recorded  Physical Exam: Physical Exam Constitutional:      Appearance: Normal appearance.  HENT:     Head: Normocephalic and atraumatic.     Nose: Nose normal.  Cardiovascular:     Rate and Rhythm: Normal rate and regular rhythm.  Pulmonary:     Effort: Pulmonary effort is normal.  Musculoskeletal:        General: Normal range of motion.  Skin:    General: Skin is warm.  Neurological:     General: No focal deficit present.     Mental Status: He is alert and oriented to person, place, and time.    Review of Systems  All other systems reviewed and are negative.  Blood pressure (!) 140/77, pulse 80, temperature 98 F (36.7 C), temperature source Oral, resp. rate 20, SpO2 99%. There is no height or weight on file to calculate BMI.  Musculoskeletal: Strength & Muscle Tone: within normal limits Gait & Station: normal Patient leans: Front   [facility] MSE Discharge Disposition for Follow up and Recommendations: Based on my evaluation the patient does not appear to have an emergency medical condition and can be discharged with resources and follow up care in outpatient services for Individual Therapy. Pt does not meet admission criteria.   DISPO Outpatient services resources provided with discharge instructions Prescription given for Atarax 25 mg PRN TID  Follow-up with PCP as  needed.  Call [phone] Crisis [facility] as needed Pt verbalized understanding of instructions.   Tosin Venita Seng, NP 05/23/2024, 3:45 PM

## 2024-05-28 ENCOUNTER — Encounter (HOSPITAL_BASED_OUTPATIENT_CLINIC_OR_DEPARTMENT_OTHER): Payer: Self-pay | Admitting: Emergency Medicine

## 2024-05-28 ENCOUNTER — Emergency Department (HOSPITAL_BASED_OUTPATIENT_CLINIC_OR_DEPARTMENT_OTHER)
Admission: EM | Admit: 2024-05-28 | Discharge: 2024-05-28 | Disposition: A | Discharge status: Home / Self Care | Attending: Emergency Medicine | Admitting: Emergency Medicine

## 2024-05-28 ENCOUNTER — Other Ambulatory Visit: Payer: Self-pay

## 2024-05-28 DIAGNOSIS — F410 Panic disorder [episodic paroxysmal anxiety] without agoraphobia: Secondary | ICD-10-CM | POA: Diagnosis not present

## 2024-05-28 DIAGNOSIS — F419 Anxiety disorder, unspecified: Secondary | ICD-10-CM | POA: Insufficient documentation

## 2024-05-28 MED ORDER — LORAZEPAM 1 MG PO TABS: 1.0000 mg | ORAL_TABLET | Freq: Three times a day (TID) | ORAL | 0 refills | Status: DC | PRN

## 2024-05-28 MED ORDER — LORAZEPAM 1 MG PO TABS
1.0000 mg | ORAL_TABLET | Freq: Once | ORAL | Status: AC
  Administered 2024-05-28: 1 mg via SUBLINGUAL
  Filled 2024-05-28: qty 1

## 2024-05-28 MED ORDER — LORAZEPAM 1 MG PO TABS: 1.0000 mg | ORAL_TABLET | Freq: Three times a day (TID) | ORAL | 0 refills | Status: AC | PRN

## 2024-05-28 NOTE — ED Triage Notes (Signed)
 Pt reports "anxiety attack off and on for 3 days." Pt appears calm. When asked if he feels it has improved at all pt states "no I'm hyperventilating pretty bad, I can't breathe and my ears are full of air". Pt not tachypneic or tachycardic at this time. Sitting still on stretcher. He states past hx of anxiety but not panic attacks. No recent medication changes, ETOH or drug use per pt.

## 2024-05-28 NOTE — ED Provider Notes (Signed)
 [facility] Provider Note   CSN: [id number] Arrival date & time: 05/28/24  0325     History  Chief Complaint  Patient presents with   Anxiety    Stephen Pandolfi Jr. is a 23 y.o. male.  Patient presents to the emergency department for evaluation of recurrent anxiety and panic attacks.  Patient seen at [facility] several days ago, prescribed Atarax .  Patient reports that he still has continuous generalized anxiety with panic attacks.       Home Medications Prior to Admission medications   Medication Sig Start Date End Date Taking? Authorizing Provider  albuterol (VENTOLIN HFA) 108 (90 Base) MCG/ACT inhaler Inhale 2 puffs into the lungs every 4 (four) hours as needed for shortness of breath or wheezing. 08/04/19   [provider]  hydrOXYzine  (ATARAX ) 25 MG tablet Take 1 tablet (25 mg total) by mouth 3 (three) times daily as needed for up to 10 days for anxiety (Panic attack). 05/23/24 06/02/24  Olasunkanmi, Oluwatosin, NP  LORazepam (ATIVAN) 1 MG tablet Take 1 tablet (1 mg total) by mouth every 8 (eight) hours as needed for anxiety. 05/28/24   Ballard Bongo, MD      Allergies    Prednisone, Diphenhydramine, and Diphenhydramine hcl    Review of Systems   Review of Systems  Physical Exam Updated Vital Signs BP (!) 142/81 (BP Location: Right Arm)   Pulse 79   Temp 98.3 F (36.8 C) (Oral)   Resp (!) 22   Ht 5\' 11"  (1.803 m)   Wt 83.5 kg   SpO2 100%   BMI 25.66 kg/m  Physical Exam Vitals and nursing note reviewed.  Constitutional:      General: He is not in acute distress.    Appearance: He is well-developed.  HENT:     Head: Normocephalic and atraumatic.     Mouth/Throat:     Mouth: Mucous membranes are moist.  Eyes:     General: Vision grossly intact. Gaze aligned appropriately.     Extraocular Movements: Extraocular movements intact.     Conjunctiva/sclera: Conjunctivae normal.  Cardiovascular:     Rate and  Rhythm: Normal rate and regular rhythm.     Pulses: Normal pulses.     Heart sounds: Normal heart sounds, S1 normal and S2 normal. No murmur heard.    No friction rub. No gallop.  Pulmonary:     Effort: Pulmonary effort is normal. No respiratory distress.     Breath sounds: Normal breath sounds.  Abdominal:     Palpations: Abdomen is soft.     Tenderness: There is no abdominal tenderness. There is no guarding or rebound.     Hernia: No hernia is present.  Musculoskeletal:        General: No swelling.     Cervical back: Full passive range of motion without pain, normal range of motion and neck supple. No pain with movement, spinous process tenderness or muscular tenderness. Normal range of motion.     Right lower leg: No edema.     Left lower leg: No edema.  Skin:    General: Skin is warm and dry.     Capillary Refill: Capillary refill takes less than 2 seconds.     Findings: No ecchymosis, erythema, lesion or wound.  Neurological:     Mental Status: He is alert and oriented to person, place, and time.     GCS: GCS eye subscore is 4. GCS verbal subscore is 5.  GCS motor subscore is 6.     Cranial Nerves: Cranial nerves 2-12 are intact.     Sensory: Sensation is intact.     Motor: Motor function is intact. No weakness or abnormal muscle tone.     Coordination: Coordination is intact.  Psychiatric:        Mood and Affect: Mood normal.        Speech: Speech normal.        Behavior: Behavior normal.     ED Results / Procedures / Treatments   Labs (all labs ordered are listed, but only abnormal results are displayed) Labs Reviewed - No data to display  EKG None  Radiology No results found.  Procedures Procedures    Medications Ordered in ED Medications  LORazepam (ATIVAN) tablet 1 mg (has no administration in time range)    ED Course/ Medical Decision Making/ A&P                                 Medical Decision Making Risk Prescription drug  management.   Patient complaining of recurrent panic attacks and generalized anxiety.  He has not been able to sleep tonight.  Not homicidal or suicidal.  Ativan here, discharged with limited prescription.  Given resources for follow-up outpatient with mental health.        Final Clinical Impression(s) / ED Diagnoses Final diagnoses:  Panic attack  Anxiety    Rx / DC Orders ED Discharge Orders          Ordered    LORazepam (ATIVAN) 1 MG tablet  Every 8 hours PRN,   Status:  Discontinued        05/28/24 0443    LORazepam (ATIVAN) 1 MG tablet  Every 8 hours PRN        05/28/24 0443              Ballard Bongo, MD 05/28/24 [phone]

## 2024-05-28 NOTE — ED Notes (Signed)
 Patient was prescribed Atarax  however was ineffective. _ Sts made him feel like he was in a dream and he remained anxious.

## 2024-05-31 ENCOUNTER — Other Ambulatory Visit (HOSPITAL_BASED_OUTPATIENT_CLINIC_OR_DEPARTMENT_OTHER): Payer: Self-pay | Admitting: Family Medicine

## 2024-05-31 DIAGNOSIS — R9431 Abnormal electrocardiogram [ECG] [EKG]: Secondary | ICD-10-CM

## 2024-06-19 ENCOUNTER — Ambulatory Visit
